# Patient Record
Sex: Male | Born: 1995 | Race: White | Hispanic: No | Marital: Single | Smoking: Current every day smoker
Health system: Southern US, Community
[De-identification: ages and names within clinical notes are randomized; demographics above are authoritative.]

---
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
## 2016-07-06 ENCOUNTER — Encounter (HOSPITAL_COMMUNITY): Payer: Self-pay | Admitting: Emergency Medicine

## 2016-07-06 ENCOUNTER — Emergency Department (HOSPITAL_COMMUNITY)
Admission: EM | Admit: 2016-07-06 | Discharge: 2016-07-06 | Disposition: A | Discharge status: Home / Self Care | Payer: Medicaid Other | Attending: Emergency Medicine | Admitting: Emergency Medicine

## 2016-07-06 DIAGNOSIS — W57XXXA Bitten or stung by nonvenomous insect and other nonvenomous arthropods, initial encounter: Secondary | ICD-10-CM | POA: Diagnosis not present

## 2016-07-06 DIAGNOSIS — L03114 Cellulitis of left upper limb: Secondary | ICD-10-CM | POA: Diagnosis not present

## 2016-07-06 DIAGNOSIS — Y9389 Activity, other specified: Secondary | ICD-10-CM | POA: Insufficient documentation

## 2016-07-06 DIAGNOSIS — F17200 Nicotine dependence, unspecified, uncomplicated: Secondary | ICD-10-CM | POA: Insufficient documentation

## 2016-07-06 DIAGNOSIS — Y9269 Other specified industrial and construction area as the place of occurrence of the external cause: Secondary | ICD-10-CM | POA: Insufficient documentation

## 2016-07-06 DIAGNOSIS — S61552A Open bite of left wrist, initial encounter: Secondary | ICD-10-CM | POA: Insufficient documentation

## 2016-07-06 DIAGNOSIS — Y990 Civilian activity done for income or pay: Secondary | ICD-10-CM | POA: Insufficient documentation

## 2016-07-06 MED ORDER — SULFAMETHOXAZOLE-TRIMETHOPRIM 800-160 MG PO TABS: 1.0000 | ORAL_TABLET | Freq: Two times a day (BID) | ORAL | 0 refills | Status: AC

## 2016-07-06 MED ORDER — SULFAMETHOXAZOLE-TRIMETHOPRIM 800-160 MG PO TABS
1.0000 | ORAL_TABLET | Freq: Once | ORAL | Status: AC
  Administered 2016-07-06: 1 via ORAL
  Filled 2016-07-06: qty 1

## 2016-07-06 NOTE — ED Triage Notes (Signed)
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   Pt has swollen red area to left wrist area

## 2016-07-06 NOTE — Discharge Instructions (Signed)
As discussed, it is very important that you monitor your condition carefully, and do not hesitate to return here for concerning changes in your condition.  Please take all medication as directed, and use warm compresses to facilitate the healing process per  Return here for concerning changes in your condition.

## 2016-07-06 NOTE — ED Provider Notes (Signed)
[facility] DEPT Provider Note   CSN: [id number] Arrival date & time: 07/06/16  1844     History   Chief Complaint Chief Complaint  Patient presents with  . Insect Bite    HPI Albert Wilkinson is a [age] y.o. male.  HPI  Patient presents with concern of lesion on the left anterior distal wrist. 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 He states that he spends substantial amount of time in tunnels. Yesterday, the patient recalls going through a nest of spiders. No immediate recognition of an injury. However, over the course of the past day he has noticed increasing erythema, darkening of the skin in the distal wrist. No loss of sensation or strength in the wrist, fingers. No fever, chills, nausea, vomiting. Patient is otherwise well, denies medical problems. No medication taken for relief.   History reviewed. No pertinent past medical history.  There are no active problems to display for this patient.   History reviewed. No pertinent surgical history.     Home Medications    Prior to Admission medications   Medication Sig Start Date End Date Taking? Authorizing Provider  sulfamethoxazole-trimethoprim (BACTRIM DS,SEPTRA DS) 800-160 MG tablet Take 1 tablet by mouth 2 (two) times daily. 07/06/16 07/13/16  Gerhard Munchobert Judge Duque, MD    Family History No family history on file.  Social History Social History  Substance Use Topics  . Smoking status: Current Every Day Smoker  . Smokeless tobacco: Never Used  . Alcohol use No     Allergies   Review of patient's allergies indicates not on file.   Review of Systems Review of Systems  Constitutional: Negative for fever.  Respiratory: Negative for shortness of breath.   Cardiovascular: Negative for chest pain.  Musculoskeletal:       Negative aside from HPI  Skin:       Negative aside from HPI  Allergic/Immunologic: Negative for immunocompromised state.  Neurological: Negative for weakness.      Physical Exam Updated Vital Signs BP 140/82 (BP Location: Right Arm)   Pulse 112   Temp 97.9 F (36.6 C)   Resp 25   Ht 5\' 9"  (1.753 m)   Wt 150 lb (68 kg)   SpO2 96%   BMI 22.15 kg/m   Physical Exam  Constitutional: He is oriented to person, place, and time. He appears well-developed. No distress.  HENT:  Head: Normocephalic and atraumatic.  Eyes: Conjunctivae and EOM are normal.  Cardiovascular: Normal rate and regular rhythm.   Pulmonary/Chest: Effort normal. No stridor. No respiratory distress.  Abdominal: He exhibits no distension.  Musculoskeletal: He exhibits no edema.  Neurological: He is alert and oriented to person, place, and time.  Patient has appropriate range of motion and sensation of all digits, hand, wrist on the affected side  Skin: Skin is warm and dry.  On the left anterior distal wrist there is a 4 cm erythematous slightly raised area with multiple punctate brown marks in the center.  Psychiatric: He has a normal mood and affect.  Nursing note and vitals reviewed.    ED Treatments / Results   Procedures Procedures (including critical care time)  Medications Ordered in ED Medications  sulfamethoxazole-trimethoprim (BACTRIM DS,SEPTRA DS) 800-160 MG per tablet 1 tablet (1 tablet Oral Given 07/06/16 1916)     Initial Impression / Assessment and Plan / ED Course  I have reviewed the triage vital signs and the nursing notes.  Pertinent labs & imaging results that were available during  my care of the patient were reviewed by me and considered in my medical decision making (see chart for details).  Clinical Course    This generally well young male presents with one day of increasing erythema, discomfort about a wound on his left wrist. Concern for cellulitis, no evidence for bacteremia or sepsis. Etiology may be spider bite, though this is unclear as well. Regardless, no palpated foreign bodies, no indication for drainage. Patient tolerated  initiation of antibiotics well, was discharged with same.  Final Clinical Impressions(s) / ED Diagnoses   Final diagnoses:  Cellulitis of left upper extremity  Insect bite, initial encounter    New Prescriptions Discharge Medication List as of 07/06/2016  7:09 PM    START taking these medications   Details  sulfamethoxazole-trimethoprim (BACTRIM DS,SEPTRA DS) 800-160 MG tablet Take 1 tablet by mouth 2 (two) times daily., Starting Thu 07/06/2016, Until Thu 07/13/2016, Print         Gerhard Munchobert Burnard Enis, MD 07/06/16 [phone]

## 2016-07-06 NOTE — ED Notes (Signed)
Pt stable, ambulatory, states understanding of discharge instructions 

## 2018-07-12 ENCOUNTER — Emergency Department (HOSPITAL_COMMUNITY)
Admission: EM | Admit: 2018-07-12 | Discharge: 2018-07-13 | Disposition: A | Discharge status: Other Acute Inpatient Hospital | Payer: Self-pay | Attending: Emergency Medicine | Admitting: Emergency Medicine

## 2018-07-12 ENCOUNTER — Encounter (HOSPITAL_COMMUNITY): Payer: Self-pay | Admitting: Emergency Medicine

## 2018-07-12 ENCOUNTER — Other Ambulatory Visit: Payer: Self-pay

## 2018-07-12 DIAGNOSIS — T401X2A Poisoning by heroin, intentional self-harm, initial encounter: Secondary | ICD-10-CM | POA: Insufficient documentation

## 2018-07-12 DIAGNOSIS — F322 Major depressive disorder, single episode, severe without psychotic features: Secondary | ICD-10-CM | POA: Insufficient documentation

## 2018-07-12 DIAGNOSIS — F17200 Nicotine dependence, unspecified, uncomplicated: Secondary | ICD-10-CM | POA: Insufficient documentation

## 2018-07-12 LAB — ACETAMINOPHEN LEVEL

## 2018-07-12 LAB — COMPREHENSIVE METABOLIC PANEL
ALK PHOS: 87 U/L (ref 38–126)
ALT: 9 U/L (ref 0–44)
AST: 21 U/L (ref 15–41)
Albumin: 4.3 g/dL (ref 3.5–5.0)
Anion gap: 12 (ref 5–15)
BUN: 7 mg/dL (ref 6–20)
CALCIUM: 8.9 mg/dL (ref 8.9–10.3)
CO2: 24 mmol/L (ref 22–32)
CREATININE: 1.03 mg/dL (ref 0.61–1.24)
Chloride: 102 mmol/L (ref 98–111)
GFR calc non Af Amer: >60 mL/min (ref >60)
GLUCOSE: 268 mg/dL — AB (ref 70–99)
Potassium: 3.5 mmol/L (ref 3.5–5.1)
Sodium: 138 mmol/L (ref 135–145)
Total Bilirubin: 0.6 mg/dL (ref 0.3–1.2)
Total Protein: 7.5 g/dL (ref 6.5–8.1)

## 2018-07-12 LAB — CBG MONITORING, ED: Glucose-Capillary: 117 mg/dL — ABNORMAL HIGH (ref 70–99)

## 2018-07-12 LAB — CBC WITH DIFFERENTIAL/PLATELET
ABS IMMATURE GRANULOCYTES: 0.07 10*3/uL (ref 0.00–0.07)
BASOS PCT: 0 %
Basophils Absolute: 0.1 10*3/uL (ref 0.0–0.1)
EOS ABS: 0.1 10*3/uL (ref 0.0–0.5)
EOS PCT: 1 %
HCT: 48.4 % (ref 39.0–52.0)
Hemoglobin: 14.9 g/dL (ref 13.0–17.0)
Immature Granulocytes: 1 %
Lymphocytes Relative: 15 %
Lymphs Abs: 2 10*3/uL (ref 0.7–4.0)
MCH: 27.1 pg (ref 26.0–34.0)
MCHC: 30.8 g/dL (ref 30.0–36.0)
MCV: 88 fL (ref 80.0–100.0)
MONO ABS: 0.7 10*3/uL (ref 0.1–1.0)
MONOS PCT: 5 %
Neutro Abs: 10.3 10*3/uL — ABNORMAL HIGH (ref 1.7–7.7)
Neutrophils Relative %: 78 %
Platelets: 327 10*3/uL (ref 150–400)
RBC: 5.5 MIL/uL (ref 4.22–5.81)
RDW: 13.6 % (ref 11.5–15.5)
WBC: 13.2 10*3/uL — ABNORMAL HIGH (ref 4.0–10.5)
nRBC: 0 % (ref 0.0–0.2)

## 2018-07-12 LAB — RAPID URINE DRUG SCREEN, HOSP PERFORMED
AMPHETAMINES: NOT DETECTED
BARBITURATES: NOT DETECTED
BENZODIAZEPINES: NOT DETECTED
Cocaine: NOT DETECTED
Opiates: NOT DETECTED
TETRAHYDROCANNABINOL: NOT DETECTED

## 2018-07-12 LAB — ETHANOL: Alcohol, Ethyl (B): <10 mg/dL (ref <10)

## 2018-07-12 LAB — SALICYLATE LEVEL: Salicylate Lvl: <7 mg/dL (ref 2.8–30.0)

## 2018-07-12 MED ORDER — SODIUM CHLORIDE 0.9 % IV BOLUS
1000.0000 mL | Freq: Once | INTRAVENOUS | Status: AC
  Administered 2018-07-12: 1000 mL via INTRAVENOUS

## 2018-07-12 MED ORDER — ACETAMINOPHEN 325 MG PO TABS: 650.0000 mg | ORAL_TABLET | ORAL | Status: DC | PRN

## 2018-07-12 MED ORDER — NICOTINE 21 MG/24HR TD PT24
21.0000 mg | MEDICATED_PATCH | Freq: Once | TRANSDERMAL | Status: AC
  Administered 2018-07-12 (×2): 21 mg via TRANSDERMAL
  Filled 2018-07-12: qty 1

## 2018-07-12 MED ORDER — SODIUM CHLORIDE 0.9 % IV SOLN: INTRAVENOUS | Status: DC

## 2018-07-12 NOTE — Progress Notes (Addendum)
Pt meets inpatient criteria per Reola Calkins, NP. Referral information has been sent to the following hospitals for review: [facility]  [facility]  [facility]  [facility]  [facility]  [facility]  [facility]  [facility]  [facility]   Disposition will continue to assist with placement needs.   Wells Guiles, LCSW, LCAS Disposition CSW [facility]S [phone]

## 2018-07-12 NOTE — ED Notes (Signed)
Pt. Place in the room Purple 48, updated pt. On plan of care.  Reviewed policies and visiting hours with pt.s mother.  Also gave pt. A pamphlet.

## 2018-07-12 NOTE — ED Notes (Signed)
TTS machine at bedside. 

## 2018-07-12 NOTE — ED Notes (Signed)
  Patient requested that his belongings be given to his parents.  Patients mother was given bag and took it to her car.
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
## 2018-07-12 NOTE — BH Assessment (Signed)
This Clinical research associate spoke with Doctor, general practice (Child psychotherapist, Disposition at [facility]) in regards to pt placement at [facility]. Maralyn Sago was notified that pt is currently being reviewed by the on call Dr Daleen Bo) for admission to the unit. Dr. Daleen Bo is reviewing the pt's chart and would like for this writer to give her a call back in an hour to obtain disposition for the pt.

## 2018-07-12 NOTE — ED Notes (Signed)
Received a call from [facility] requesting IVC (no longer applicable) and nursing notes. Contacted Sarah Social Work and she has sent information and will call to confirm with them.

## 2018-07-12 NOTE — ED Provider Notes (Signed)
Labs wnl.  Tachycardia improved after 2nd L of fluids.  Pt evaled by [facility] who recommended inpt.  Will move to psych hold.  Pt is medically clear.   Gwyneth Sprout, MD 07/12/18 [phone]

## 2018-07-12 NOTE — ED Notes (Signed)
Patient has been wanded by security and placed in purple scrubs.  Patient belongings have been taken from room and are at charge desk.

## 2018-07-12 NOTE — ED Provider Notes (Signed)
[facility] EMERGENCY DEPARTMENT Provider Note  CSN: [id number] Arrival date & time: 07/12/18 0545  Chief Complaint(s) Drug Overdose and Suicide Attempt  HPI Albert Wilkinson is a 22 y.o. male with a history of reported depression presents to the emergency departments for suicide attempt by overdosing on heroin.  Patient reports that he has been in remission for several months and has been having a lot of stress recently prompting him to use "a lot of heroin."  He denied any prior suicide attempts.  Denies any other coingestions.  Patient was found by [employer] after they were called for wellness check.  Patient received 1 dose of intranasal Narcan to which she responded well to.  EMS transported patient here to the emergency department and he remained hemodynamically stable in route.  He denies any recent fevers or infections.  No current headache, visual disturbance, chest pain, shortness of breath, abdominal pain, nausea.  Denies any recent vomiting or diarrhea.  Denies any homicidal ideations or AVH.  HPI  Past Medical History History reviewed. No pertinent past medical history. There are no active problems to display for this patient.  Home Medication(s) Prior to Admission medications   Not on File                                                                                                                                    Past Surgical History History reviewed. No pertinent surgical history. Family History History reviewed. No pertinent family history.  Social History Social History   Tobacco Use  . Smoking status: Current Every Day Smoker  . Smokeless tobacco: Never Used  Substance Use Topics  . Alcohol use: No  . Drug use: No   Allergies Patient has no known allergies.  Review of Systems Review of Systems All other systems are reviewed and are negative for acute change except as noted in the HPI  Physical Exam Vital Signs  I have reviewed the triage  vital signs BP (!) 149/90 (BP Location: Right Arm)   Pulse (!) 123   Temp 97.9 F (36.6 C) (Oral)   Resp 20   SpO2 100%   Physical Exam  Constitutional: He is oriented to person, place, and time. He appears well-developed and well-nourished. No distress.  HENT:  Head: Normocephalic and atraumatic.  Nose: Nose normal.  Eyes: Pupils are equal, round, and reactive to light. Conjunctivae and EOM are normal. Right eye exhibits no discharge. Left eye exhibits no discharge. No scleral icterus.  Neck: Normal range of motion. Neck supple.  Cardiovascular: Regular rhythm. Tachycardia present. Exam reveals no gallop and no friction rub.  No murmur heard. Pulmonary/Chest: Effort normal and breath sounds normal. No stridor. No respiratory distress. He has no rales.  Abdominal: Soft. He exhibits no distension. There is no tenderness.  Musculoskeletal: He exhibits no edema or tenderness.  Neurological: He is alert and oriented to person, place, and time.  Skin: Skin is warm and dry. No rash noted. He is not diaphoretic. No erythema.  Psychiatric: He has a normal mood and affect.  Vitals reviewed.   ED Results and Treatments Labs (all labs ordered are listed, but only abnormal results are displayed) Labs Reviewed  CBC WITH DIFFERENTIAL/PLATELET - Abnormal; Notable for the following components:      Result Value   WBC 13.2 (*)    Neutro Abs 10.3 (*)    All other components within normal limits  CBG MONITORING, ED - Abnormal; Notable for the following components:   Glucose-Capillary 117 (*)    All other components within normal limits  COMPREHENSIVE METABOLIC PANEL  SALICYLATE LEVEL  ACETAMINOPHEN LEVEL  ETHANOL  RAPID URINE DRUG SCREEN, HOSP PERFORMED  ACETAMINOPHEN LEVEL                                                                                                                         EKG  EKG Interpretation  Date/Time:  Friday July 12 2018 05:51:29 EST Ventricular Rate:   111 PR Interval:    QRS Duration: 86 QT Interval:  341 QTC Calculation: 464 R Axis:   82 Text Interpretation:  Sinus tachycardia NO STEMI No old tracing to compare Confirmed by Drema Pry [phone]) on 07/12/2018 7:14:54 AM      Radiology No results found. Pertinent labs & imaging results that were available during my care of the patient were reviewed by me and considered in my medical decision making (see chart for details).  Medications Ordered in ED Medications  sodium chloride 0.9 % bolus 1,000 mL (1,000 mLs Intravenous New Bag/Given 07/12/18 0622)    And  0.9 %  sodium chloride infusion (has no administration in time range)                                                                                                                                    Procedures Procedures  (including critical care time)  Medical Decision Making / ED Course I have reviewed the nursing notes for this encounter and the patient's prior records (if available in EHR or on provided paperwork).    Suicide attempt by heroin overdose.  Patient is currently afebrile with mild tachycardia but hemodynamically stable.  Alert and oriented x4.  Coingestion labs will be obtained.  He will require several hours of observation for medical clearance followed by behavioral health  evaluation.  IVC paperwork completed, but patient is willing to stay voluntarily.  Patient care turned over to Dr Anitra Lauth at 0700. Patient case and results discussed in detail; please see their note for further ED managment.     Final Clinical Impression(s) / ED Diagnoses Final diagnoses:  Intentional heroin overdose, initial encounter [facility])      This chart was dictated using voice recognition software.  Despite best efforts to proofread,  errors can occur which can change the documentation meaning.   Nira Conn, MD 07/12/18 [phone]

## 2018-07-13 ENCOUNTER — Inpatient Hospital Stay (HOSPITAL_COMMUNITY)
Admission: AD | Admit: 2018-07-13 | Discharge: 2018-07-15 | DRG: 885 | Disposition: A | Discharge status: Home / Self Care | Payer: Federal, State, Local not specified - Other | Source: Intra-hospital | Attending: Psychiatry | Admitting: Psychiatry

## 2018-07-13 ENCOUNTER — Other Ambulatory Visit: Payer: Self-pay

## 2018-07-13 ENCOUNTER — Encounter (HOSPITAL_COMMUNITY): Payer: Self-pay | Admitting: *Deleted

## 2018-07-13 DIAGNOSIS — F332 Major depressive disorder, recurrent severe without psychotic features: Principal | ICD-10-CM | POA: Diagnosis present

## 2018-07-13 DIAGNOSIS — F1490 Cocaine use, unspecified, uncomplicated: Secondary | ICD-10-CM | POA: Diagnosis present

## 2018-07-13 DIAGNOSIS — Z23 Encounter for immunization: Secondary | ICD-10-CM

## 2018-07-13 DIAGNOSIS — F1120 Opioid dependence, uncomplicated: Secondary | ICD-10-CM | POA: Diagnosis present

## 2018-07-13 MED ORDER — TRAZODONE HCL 50 MG PO TABS
50.0000 mg | ORAL_TABLET | Freq: Every evening | ORAL | Status: DC | PRN
  Filled 2018-07-13: qty 1
  Filled 2018-07-13: qty 7

## 2018-07-13 MED ORDER — INFLUENZA VAC SPLIT QUAD 0.5 ML IM SUSY
0.5000 mL | PREFILLED_SYRINGE | INTRAMUSCULAR | Status: AC
  Administered 2018-07-14: 0.5 mL via INTRAMUSCULAR
  Filled 2018-07-13: qty 0.5

## 2018-07-13 MED ORDER — NICOTINE 21 MG/24HR TD PT24
21.0000 mg | MEDICATED_PATCH | Freq: Every day | TRANSDERMAL | Status: DC
  Administered 2018-07-13: 21 mg via TRANSDERMAL
  Filled 2018-07-13: qty 1

## 2018-07-13 MED ORDER — ACETAMINOPHEN 325 MG PO TABS: 650.0000 mg | ORAL_TABLET | ORAL | Status: DC | PRN

## 2018-07-13 MED ORDER — ALUM & MAG HYDROXIDE-SIMETH 200-200-20 MG/5ML PO SUSP: 30.0000 mL | ORAL | Status: DC | PRN

## 2018-07-13 MED ORDER — MAGNESIUM HYDROXIDE 400 MG/5ML PO SUSP: 30.0000 mL | Freq: Every day | ORAL | Status: DC | PRN

## 2018-07-13 MED ORDER — HYDROXYZINE HCL 25 MG PO TABS
25.0000 mg | ORAL_TABLET | Freq: Four times a day (QID) | ORAL | Status: DC | PRN
  Filled 2018-07-13: qty 10
  Filled 2018-07-13: qty 1

## 2018-07-13 MED ORDER — PNEUMOCOCCAL VAC POLYVALENT 25 MCG/0.5ML IJ INJ
0.5000 mL | INJECTION | INTRAMUSCULAR | Status: AC
  Administered 2018-07-14: 0.5 mL via INTRAMUSCULAR

## 2018-07-13 MED ORDER — NICOTINE 21 MG/24HR TD PT24
21.0000 mg | MEDICATED_PATCH | Freq: Once | TRANSDERMAL | Status: DC
  Administered 2018-07-14: 21 mg via TRANSDERMAL
  Filled 2018-07-13: qty 1

## 2018-07-13 NOTE — ED Notes (Signed)
Pt given bed in place of stretcher for comfort. Pt noted to be lying on bed watching tv.

## 2018-07-13 NOTE — Progress Notes (Signed)
Did not attend group 

## 2018-07-13 NOTE — ED Notes (Signed)
Mother and her significant other visiting w/pt.

## 2018-07-13 NOTE — Progress Notes (Signed)
D.  Pt is new admission to unit, went to bed shortly after arrival.  Pt did not get up for evening wrap up group.  No interaction on unit at this time.  Pt resting in bed with eyes closed, even and unlabored respirations   A.  Will continue to monitor.  R.  Pt remain safe on the unit.

## 2018-07-13 NOTE — ED Notes (Signed)
Pt voiced understanding and agreement w/tx plan - accepted to [facility] - signed consent form - copy faxed to [facility], copy sent to Medical Records, and original placed in envelope for [facility]. Pt stated he will wait to advise his mother when she returns.

## 2018-07-13 NOTE — BH Assessment (Addendum)
Per Isidoro Donning, pt is accepted to [facility] 306-2 to arrive after 5 pm. Attending MD is Dr. Altamese Avoca. Notified Kriste Basque, ED RN.

## 2018-07-13 NOTE — ED Notes (Signed)
Pt and parents aware of tx plan - Inpt. Pt states his Nicotine Patch fell off in shower - new one applied. Parents leaving at this time.

## 2018-07-13 NOTE — BH Assessment (Signed)
Reassessment--Pt was calm and cooperative with a pleasant demeanor. He states that he is feeling "much better" and that he got a lot of sleep. He admits to attempting suicide and knowing that the amount of his overdose he used would possibly kill him. He denies HI, AVH. He states that his conflict with his child's mother has been ongoing for a while. He states that he works at [employer]. Pt states that he has not used in a while before this overdose. Pt is oriented x4 with casual appearance, pleasant mood with congruent affect. He states that he would like treatment to help him stabilize.  TTS continues to seek placement.

## 2018-07-13 NOTE — Tx Team (Signed)
Initial Treatment Plan 07/13/2018 6:45 PM [name] ZOX:[id number]    PATIENT STRESSORS: Marital or family conflict Substance abuse   PATIENT STRENGTHS: Ability for insight Average or above average intelligence Communication skills General fund of knowledge   PATIENT IDENTIFIED PROBLEMS: Depression Anxiety Suicidal thoughts Substance abuse "Whatever I can do to help with my depression and anxiety"                     DISCHARGE CRITERIA:  Ability to meet basic life and health needs Improved stabilization in mood, thinking, and/or behavior Reduction of life-threatening or endangering symptoms to within safe limits Verbal commitment to aftercare and medication compliance  PRELIMINARY DISCHARGE PLAN: Attend aftercare/continuing care group Return to previous living arrangement  PATIENT/FAMILY INVOLVEMENT: This treatment plan has been presented to and reviewed with the patient, Albert Wilkinson, and/or family member, .  The patient and family have been given the opportunity to ask questions and make suggestions.  Leea Rambeau, [place], [place] 07/13/2018, 6:45 PM

## 2018-07-13 NOTE — ED Notes (Signed)
Pt's mother and sister visiting.

## 2018-07-13 NOTE — Progress Notes (Signed)
Quantarius is a 22 year old male pt admitted on voluntary basis. On admission, he denies any SI currently but does endorse that he has been feeling suicidal recently. He is able to contract for safety while in the hospital. He reports that he was on the phone with a friend and told him what he was doing and the friend called police who then went to check on him. He reports some problems with the mother of his child as a current stressor. He reports a past history of heroin usage but reports that he does not use on a daily basis and reports that he does not use any other substances. He reports that he is not now nor has he ever been on any medications. He denies any medical problems and does not appear in any acute distress on admission. He reports that he lives at home with his mother, sister and his child and reports that he will return there once he is discharged. Americo was escorted to the unit, oriented to the milieu and safety maintained.

## 2018-07-13 NOTE — ED Notes (Signed)
Breakfast tray ordered 

## 2018-07-14 DIAGNOSIS — F332 Major depressive disorder, recurrent severe without psychotic features: Principal | ICD-10-CM

## 2018-07-14 DIAGNOSIS — F1120 Opioid dependence, uncomplicated: Secondary | ICD-10-CM

## 2018-07-14 MED ORDER — CITALOPRAM HYDROBROMIDE 10 MG PO TABS
10.0000 mg | ORAL_TABLET | Freq: Every day | ORAL | Status: DC
  Administered 2018-07-14 – 2018-07-15 (×2): 10 mg via ORAL
  Filled 2018-07-14: qty 7
  Filled 2018-07-14 (×4): qty 1

## 2018-07-14 NOTE — BHH Suicide Risk Assessment (Signed)
[facility] Admission Suicide Risk Assessment   Nursing information obtained from:  Patient Demographic factors:  Male, Adolescent or young adult, Caucasian Current Mental Status:  NA Loss Factors:  NA Historical Factors:  NA Risk Reduction Factors:  Responsible for children under 21 years of age, Living with another person, especially a relative, Positive coping skills or problem solving skills  Total Time spent with patient: 20 minutes Principal Problem: <principal problem not specified> Diagnosis:   Patient Active Problem List   Diagnosis Date Noted  . Severe recurrent major depression without psychotic features (HCC) [F33.2] 07/13/2018   Subjective Data: Patient is seen and examined.  Patient is a 22 year old male with a past psychiatric history significant for opioid dependence who presented to the [facility] emergency department on 07/12/2018 after an intentional overdose of heroin.  Patient stated that he had been having problems with the biological mother of his child.  He stated that he gets into arguments with her over custody issues of their child.  They are trying to work these things out, but he continues to get upset over dealing with her.  On the night of admission he got into an argument, and this upset him so greatly that he decided to take an intentional overdose of heroin.  He stated he snorted to $20 bag of heroin in an attempt to kill himself.  He has a history of opioid dependence, but approximately 8 to 12 months ago had stopped using drugs on his own.  He has been clean for several months until the night of admission.  He also had recently got a job in a [employer].  He denied any previous self-harm, he denied any previous psychiatric admissions, he denied any previous admissions for substance rehabilitation.  He currently lives with his mother and his sister.  After the intentional overdose of heroin he was found by [facility] police after they were called for a wellness check.   He received 1 dose of intranasal Narcan to which he responded quite well.  He was basically kept in the emergency room for 2 days until a bed could be found for admission.  He was admitted to the hospital for evaluation and stabilization.  He denied any devious treatment for depression.  He stated that he was feeling depressed about the circumstances with the biological mother of the child.  He stated he felt at times helpless, hopeless, worthless, upset, and angry.  He was admitted to the hospital for evaluation and stabilization.  Continued Clinical Symptoms:  Alcohol Use Disorder Identification Test Final Score (AUDIT): 0 The "Alcohol Use Disorders Identification Test", Guidelines for Use in Primary Care, Second Edition.  World Science writer [facility]). Score between 0-7:  no or low risk or alcohol related problems. Score between 8-15:  moderate risk of alcohol related problems. Score between 16-19:  high risk of alcohol related problems. Score 20 or above:  warrants further diagnostic evaluation for alcohol dependence and treatment.   CLINICAL FACTORS:   Depression:   Anhedonia Comorbid alcohol abuse/dependence Hopelessness Impulsivity Insomnia Alcohol/Substance Abuse/Dependencies   Musculoskeletal: Strength & Muscle Tone: within normal limits Gait & Station: normal Patient leans: N/A  Psychiatric Specialty Exam: Physical Exam  Nursing note and vitals reviewed. Constitutional: He is oriented to person, place, and time. He appears well-developed and well-nourished.  HENT:  Head: Normocephalic and atraumatic.  Respiratory: Effort normal.  Neurological: He is alert and oriented to person, place, and time.    ROS  Blood pressure (!) 152/74, pulse  71, temperature 97.9 F (36.6 C), temperature source Oral, resp. rate 18, height 5\' 8"  (1.727 m), weight 59.9 kg.Body mass index is 20.07 kg/m.  General Appearance: Casual  Eye Contact:  Good  Speech:  Normal Rate  Volume:  Normal   Mood:  Anxious  Affect:  Congruent  Thought Process:  Coherent and Descriptions of Associations: Intact  Orientation:  Full (Time, Place, and Person)  Thought Content:  Logical  Suicidal Thoughts:  No  Homicidal Thoughts:  No  Memory:  Immediate;   Fair Recent;   Fair Remote;   Fair  Judgement:  Intact  Insight:  Fair  Psychomotor Activity:  Normal  Concentration:  Concentration: Fair and Attention Span: Fair  Recall:  [employer] of Knowledge:  Good  Language:  Good  Akathisia:  Negative  Handed:  Right  AIMS (if indicated):     Assets:  Communication Skills Desire for Improvement Financial Resources/Insurance Housing Physical Health Resilience Social Support  ADL's:  Intact  Cognition:  WNL  Sleep:  Number of Hours: 6.75      COGNITIVE FEATURES THAT CONTRIBUTE TO RISK:  None    SUICIDE RISK:   Minimal: No identifiable suicidal ideation.  Patients presenting with no risk factors but with morbid ruminations; may be classified as minimal risk based on the severity of the depressive symptoms  PLAN OF CARE: Patient is seen and examined.  Patient is a 22 year old male with the above-stated past psychiatric history who was admitted to the hospital after an intentional overdose of heroin.  He has no history of suicide attempts, has been clean of opiates for several months, and has never been admitted to the psychiatric hospital.  He denied any previous history of psychiatric evaluations or psychiatric treatment.  He did admit to neurovegetative symptoms of depression.  We will start Celexa 10 mg p.o. daily.  He will be admitted to the hospital.  He will be integrated into the milieu.  He will be encouraged to attend groups.  We will talk to his mother and get collateral information to confirm all the above.  He will be placed on 15-minute checks for safety as well as monitoring for withdrawal from heroin in case he is not being openly upfront with [place].  His drug screen was  essentially negative, but his blood sugar is significantly elevated.  We will monitor his blood sugar to make sure that he is not developing diabetes.  I certify that inpatient services furnished can reasonably be expected to improve the patient's condition.   Antonieta Pert, MD 07/14/2018, 9:31 AM

## 2018-07-14 NOTE — H&P (Signed)
Psychiatric Admission Assessment Adult  Patient Identification: Albert Wilkinson MRN:  [id number] Date of Evaluation:  07/14/2018 Chief Complaint:  MDD WITH SI ATTEMPT COCAINE USE Principal Diagnosis: Severe recurrent major depression without psychotic features (HCC) Diagnosis:   Patient Active Problem List   Diagnosis Date Noted  . Uncomplicated opioid dependence (HCC) [F11.20]   . Severe recurrent major depression without psychotic features (HCC) [F33.2] 07/13/2018   CC: I attempted suicide.   He resides with his mother and sister (30), and his 80 month old son. He is currently employed and works full time. Currently not in school at this time. He denies any psychiatric illness prior to this suicide attempt and reports this attempt was impulsive and situational. "Immediately after I regretted it and I was remorseful. I have never used drugs prior to the attempt.    Per EDP Report: Albert Wilkinson a22 y.o.malewith a history of reported depression presents to the emergency departments for suicide attempt by overdosing on heroin. Patient reports that he has been in remission for several months and has been having a lot of stress recently prompting him to use "a lot of heroin." He denied any prior suicide attempts. Denies any other coingestions. Patient was found by [facility] after they were called for wellness check. Patient received 1 dose of intranasal Narcan to which she responded well to. EMS transported patient here to the emergency department and he remained hemodynamically stable in route.  TTS Report: Patient states that he and his child's mother got into an argument last night.  He states that everytime they are not getting along that she threatens to take his son away from him and not let him see his son anymore.  Patient states that she has done this on several occasions and he states that she has left with his son on a couple of occasions.  He states that last night she made  these threats because she felt like patient had eaten some food that she had purchased for their son.  Patient states that he tries to reason with his child's mother, but she refuses to listen.  Patient states that he gets really depressed over the situation and last night that he was so upset that he tried to overdose on heroin in a suicide attempt, he snorted a $20 bag, in an attempt to kill himself.  Patient states that he has a history of heroin use, but had been clean for several months until last night.  After his use of the heroin, he became non-responsive.  Patient denies any history of treatment for his depression on an inpatient basis or an outpatient basis.  Patient denies HI/Psychosis.  Patient states that he has experienced the following depressive symptoms: decreased concentration, feeling hopeless or helpless, decreased energy and motivation as well as a depressed mood. Patient states that he started using heroin at age 39, but states that he quit several months ago.  Patient states that he has a history of using daily, 1/2 gram.  Patient states that he has never been in any SA treatment programs.  Patient states that eh lives with his mother, her boyfriend, his girlfriend and his child as well as a sister, her daughter and boyfriend.  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  Patient presented as alert and oriented.  His mood depressed and his affect was depressed.  Patient's insight, judgement and impulse control were impaired.  His thoughts were organized and  his memory intact.  Patient's eye contact was good and his speech was clear and coherent.  His psycho-motor activity was normal.  He did not appear to be responding to any internal stimuli.  Patient was cooperative, clean and neat.  Associated Signs/Symptoms: Depression Symptoms:  depressed mood, suicidal attempt, loss of energy/fatigue, disturbed sleep, (Hypo) Manic Symptoms:   Impulsivity, Irritable Mood, Anxiety Symptoms:  Excessive worry Psychotic Symptoms:  Denies PTSD Symptoms: Negative Total Time spent with patient: 30 minutes  Past Psychiatric History: Denies. Reports first suicide attempt and feels regretful at this time. He denies any substance abuse history. No previous psychiatric medications.   Is the patient at risk to self? Yes.    Has the patient been a risk to self in the past 6 months? Yes.    Has the patient been a risk to self within the distant past? No.  Is the patient a risk to others? No.  Has the patient been a risk to others in the past 6 months? No.  Has the patient been a risk to others within the distant past? No.   Prior Inpatient Therapy:  Denies  Prior Outpatient Therapy:  Denies  Alcohol Screening: 1. How often do you have a drink containing alcohol?: Never 2. How many drinks containing alcohol do you have on a typical day when you are drinking?: 1 or 2 3. How often do you have six or more drinks on one occasion?: Never AUDIT-C Score: 0 4. How often during the last year have you found that you were not able to stop drinking once you had started?: Never 5. How often during the last year have you failed to do what was normally expected from you becasue of drinking?: Never 6. How often during the last year have you needed a first drink in the morning to get yourself going after a heavy drinking session?: Never 7. How often during the last year have you had a feeling of guilt of remorse after drinking?: Never 8. How often during the last year have you been unable to remember what happened the night before because you had been drinking?: Never 9. Have you or someone else been injured as a result of your drinking?: No 10. Has a relative or friend or a doctor or another health worker been concerned about your drinking or suggested you cut down?: No Alcohol Use Disorder Identification Test Final Score (AUDIT):  0 Intervention/Follow-up: AUDIT Score <7 follow-up not indicated Substance Abuse History in the last 12 months:  Yes.   Consequences of Substance Abuse: Withdrawal Symptoms:   Headaches Tremors Previous Psychotropic Medications: No  Psychological Evaluations: No  Past Medical History: History reviewed. No pertinent past medical history. History reviewed. No pertinent surgical history. Family History: History reviewed. No pertinent family history. Family Psychiatric  History: Mother and Father- history of substance abuse.  Tobacco Screening: Have you used any form of tobacco in the last 30 days? (Cigarettes, Smokeless Tobacco, Cigars, and/or Pipes): Yes Tobacco use, Select all that apply: 5 or more cigarettes per day Are you interested in Tobacco Cessation Medications?: Yes, will notify MD for an order Counseled patient on smoking cessation including recognizing danger situations, developing coping skills and basic information about quitting provided: Refused/Declined practical counseling Social History:  Social History   Substance and Sexual Activity  Alcohol Use No     Social History   Substance and Sexual Activity  Drug Use Yes  . Types: Heroin    Additional Social History:  Allergies:  No Known Allergies Lab Results: No results found for this or any previous visit (from the past 48 hour(s)).  Blood Alcohol level:  Lab Results  Component Value Date   ETH <10 07/12/2018    Metabolic Disorder Labs:  No results found for: HGBA1C, MPG No results found for: PROLACTIN No results found for: CHOL, TRIG, HDL, CHOLHDL, VLDL, LDLCALC  Current Medications: Current Facility-Administered Medications  Medication Dose Route Frequency Provider Last Rate Last Dose  . acetaminophen (TYLENOL) tablet 650 mg  650 mg Oral Q4H PRN Ravi, Himabindu, MD      . alum & mag hydroxide-simeth (MAALOX/MYLANTA) 200-200-20 MG/5ML suspension 30 mL  30 mL Oral Q4H PRN Nira Conn A, NP      .  citalopram (CELEXA) tablet 10 mg  10 mg Oral Daily Antonieta Pert, MD   10 mg at 07/14/18 0931  . hydrOXYzine (ATARAX/VISTARIL) tablet 25 mg  25 mg Oral Q6H PRN Nira Conn A, NP      . magnesium hydroxide (MILK OF MAGNESIA) suspension 30 mL  30 mL Oral Daily PRN Nira Conn A, NP      . nicotine (NICODERM CQ - dosed in mg/24 hours) patch 21 mg  21 mg Transdermal Once Daleen Bo, Himabindu, MD   21 mg at 07/14/18 0933  . traZODone (DESYREL) tablet 50 mg  50 mg Oral QHS PRN Nira Conn A, NP       PTA Medications: No medications prior to admission.    Musculoskeletal: Strength & Muscle Tone: within normal limits Gait & Station: normal Patient leans: N/A  Psychiatric Specialty Exam: Physical Exam  ROS  Blood pressure 116/85, pulse (!) 128, temperature 98.2 F (36.8 C), temperature source Oral, resp. rate 16, height 5\' 8"  (1.727 m), weight 59.9 kg.Body mass index is 20.07 kg/m.  General Appearance: Fairly Groomed  Eye Contact:  Fair  Speech:  Clear and Coherent and Normal Rate  Volume:  Normal  Mood:  normal and better  Affect:  Congruent  Thought Process:  Coherent, Linear and Descriptions of Associations: Intact  Orientation:  Full (Time, Place, and Person)  Thought Content:  WDL  Suicidal Thoughts:  No  Homicidal Thoughts:  No  Memory:  Immediate;   Fair Recent;   Fair  Judgement:  Intact  Insight:  Present  Psychomotor Activity:  Normal  Concentration:  Concentration: Fair and Attention Span: Fair  Recall:  [employer] of Knowledge:  Fair  Language:  Fair  Akathisia:  No  Handed:  Right  AIMS (if indicated):     Assets:  Communication Skills Desire for Improvement Financial Resources/Insurance Housing Leisure Time Social Support  ADL's:  Intact  Cognition:  WNL  Sleep:  Number of Hours: 6.75    Treatment Plan Summary: Daily contact with patient to assess and evaluate symptoms and progress in treatment and Medication management  1 Admit for crisis management  and stabilization.  2. Medication management to reduce symptoms to baseline and improved the patient's overall level of functioning. Closely monitor the side effects, efficacy and therapeutic response of medication.  3. Treat health problem as indicated. Citalopram 10mg  po daily for depression and anxiety. First dose of celexa was administered today.  4. Developed treatment plan to decrease the risk of relapse upon discharge and to reduce the need for readmission.  5. Psychosocial education regarding relapse prevention in self-care.  6. Healthcare followup as needed for medical problems and called consults as indicated.  7. Increase collateral information.  8. Restart home medication where appropriate  9. Encouraged to participate and verbalize into group milieu therapy.   Observation Level/Precautions:  15 minute checks  Laboratory:  Labs obtained in the ED have been reviewed and assessed.   Psychotherapy:  Individual and group therapy  Medications:  See above  Consultations:  Per need  Discharge Concerns:   See above  Estimated LOS: 3-5 days  Other:     Physician Treatment Plan for Primary Diagnosis: Severe recurrent major depression without psychotic features (HCC) Long Term Goal(s): Improvement in symptoms so as ready for discharge  Short Term Goals: Ability to identify changes in lifestyle to reduce recurrence of condition will improve, Ability to verbalize feelings will improve and Ability to disclose and discuss suicidal ideas  Physician Treatment Plan for Secondary Diagnosis: Active Problems:   Severe recurrent major depression without psychotic features (HCC)   Uncomplicated opioid dependence (HCC)  Long Term Goal(s): Improvement in symptoms so as ready for discharge  Short Term Goals: Ability to identify and develop effective coping behaviors will improve, Ability to maintain clinical measurements within normal limits will improve and Compliance with prescribed medications  will improve  I certify that inpatient services furnished can reasonably be expected to improve the patient's condition.    Maryagnes Amos, FNP 11/10/20191:43 PM

## 2018-07-14 NOTE — Progress Notes (Signed)
D.  Pt pleasant on approach, no complaints voiced.  Pt was positive for evening AA group, observed engaged in appropriate interaction with peers on the unit.  Pt denies SI/HI/AVH at this time.  A.  Support and encouragement offered, medication given as ordered  R.  Pt remains safe on the unit, will continue to monitor.   

## 2018-07-14 NOTE — [facility] Group Notes (Signed)
[facility] Group Notes:  (Nursing)  Date:  07/14/2018  Time: 130 PM Type of Therapy:  Nurse Education  Participation Level:  Active  Participation Quality:  Appropriate and Attentive  Affect:  Appropriate  Cognitive:  Alert and Appropriate  Insight:  Appropriate and Good  Engagement in Group:  Engaged  Modes of Intervention:  Discussion and Education  Summary of Progress/Problems: Nurse led psycho-educational group: Sleep hygiene  Shela Nevin 07/14/2018, 3:08 PM

## 2018-07-14 NOTE — B[facility] Counselor (Signed)
Adult Comprehensive Assessment  Patient ID: MATTIA LIFORD, male   DOB: Jun 08, 1996, 22 y.o.   MRN: [id number]  Information Source: Information source: Patient  Current Stressors:  Patient states their primary concerns and needs for treatment are:: anxiety and depression Patient states their goals for this hospitilization and ongoing recovery are:: Working treating depression and anxiety Educational / Learning stressors: no Employment / Job issues: no Family Relationships: no Surveyor, quantity / Lack of resources (include bankruptcy): no Housing / Lack of housing: no Physical health (include injuries & life threatening diseases): no Social relationships: The mother of my child  Substance abuse: is currently abstinent from heroin Bereavement / Loss: loss my father when I was [age]  Living/Environment/Situation:  Living Arrangements: Parent Living conditions (as described by patient or guardian): good Who else lives in the home?: mother, sister and my child How long has patient lived in current situation?: 4 years What is atmosphere in current home: Comfortable, Paramedic  Family History:  Marital status: Single Are you sexually active?: No What is your sexual orientation?: straight male Has your sexual activity been affected by drugs, alcohol, medication, or emotional stress?: no Does patient have children?: Yes How many children?: 1 How is patient's relationship with their children?: good with 56 month old son  Childhood History:  By whom was/is the patient raised?: Grandparents Additional childhood history information: Father died when patient was 12 , mother was not emotionally available Description of patient's relationship with caregiver when they were a child: Very close Patient's description of current relationship with people who raised him/her: Emelia Loron has passed away and grandmother died How were you disciplined when you got in trouble as a child/adolescent?: no problems,  spanked Does patient have siblings?: Yes Number of Siblings: 3 Description of patient's current relationship with siblings: with middle sister I am close, don't get along with she older sister, not much with half Did patient suffer any verbal/emotional/physical/sexual abuse as a child?: No Did patient suffer from severe childhood neglect?: No Has patient ever been sexually abused/assaulted/raped as an adolescent or adult?: No Was the patient ever a victim of a crime or a disaster?: No Witnessed domestic violence?: No Has patient been effected by domestic violence as an adult?: No  Education:  Highest grade of school patient has completed: 12th grade Currently a student?: No Learning disability?: No  Employment/Work Situation:   Employment situation: Employed Where is patient currently employed?: [employer] How long has patient been employed?: 8 months  Patient's job has been impacted by current illness: No What is the longest time patient has a held a job?: [employer] 2 for 4 years Did You Receive Any Psychiatric Treatment/Services While in Equities trader?: No Are There Guns or Other Weapons in Your Home?: Yes Types of Guns/Weapons: Guns Are These Comptroller?: Yes  Financial Resources:   Financial resources: Income from employment Does patient have a representative payee or guardian?: No  Alcohol/Substance Abuse:   What has been your use of drugs/alcohol within the last 12 months?: one or twice this year but is abstinent If attempted suicide, did drugs/alcohol play a role in this?: No Alcohol/Substance Abuse Treatment Hx: Denies past history Has alcohol/substance abuse ever caused legal problems?: No  Social Support System:   Patient's Community Support System: Good Describe Community Support System: mother, sister, her boyfriend Type of faith/religion: Ephriam Knuckles How does patient's faith help to cope with current illness?: It does  Leisure/Recreation:    Leisure and Hobbies: Video games, playing football,  boating, hunting, reading  Strengths/Needs:   What is the patient's perception of their strengths?: Good dad, helpful, kind, quick learner Patient states they can use these personal strengths during their treatment to contribute to their recovery: Being more active talking about it, make new friend Patient states these barriers may affect/interfere with their treatment: no Patient states these barriers may affect their return to the community: no Other important information patient would like considered in planning for their treatment: no  Discharge Plan:   Currently receiving community mental health services: No Does patient have access to transportation?: Yes Does patient have financial barriers related to discharge medications?: No Will patient be returning to same living situation after discharge?: Yes  Summary/Recommendations:   Summary and Recommendations (to be completed by the evaluator): KEONDRE MARKSON is a 22 y.o. male with a history of reported depression presents to the emergency departments for suicide attempt by overdosing on heroin.  Patient reports that he has been in remission for several months and has been having a lot of stress recently prompting him to use "a lot of heroin."  He denied any prior suicide attempts.   Patient was found by [facility] after they were called for wellness check.  Patient received 1 dose of intranasal Narcan to which she responded well to.  Primary stressor is related to conflict between him and the mother . Patient states that he has experienced the following depressive symptoms: decreased concentration, feeling hopeless or helpless, decreased energy and motivation as well as a depressed mood. Patient states that he started using heroin at age 22, but states that he quit several months ago.     Patient will benefit from crisis stabilization, medication evaluation, group therapy and psychoeducation, in  addition to case management for discharge planning. At discharge it is recommended that Patient adhere to the established discharge plan and continue in treatment. Anticipated outcomes: Mood will be stabilized, crisis will be stabilized, medications will be established if appropriate, coping skills will be taught and practiced, family session will be done to determine discharge plan, mental illness will be normalized, patient will be better equipped to recognize symptoms and ask for assistance.   Evorn Gong. 07/14/2018

## 2018-07-14 NOTE — Progress Notes (Addendum)
D. Pt presents with an anxious affect/mood- friendly during interactions. Calm, cooperative behavior. Pt appears to minimize his actions- reporting that his SA was impulsive, and that he is ready to be discharged so that he can go home and be with his 17 mo old child. Per pt's self inventory, pt rates his depression, hopelessness and anxiety 2/0/5, respectively. Pt writes that his most important goal today is "beating depression". Pt currently denies SI/HI and AV hallucinations A. Labs and vitals monitored. Pt compliant with medications. Pt supported emotionally and encouraged to express concerns and ask questions.   R. Pt remains safe with 15 minute checks. Will continue POC.

## 2018-07-14 NOTE — [facility] Group Notes (Signed)
[facility] LCSW Group Therapy Note  07/14/2018  9:00-10:00AM  Type of Therapy and Topic:  Group Therapy:  Adding Supports Including Being Your Own Support  Participation Level:  None   Description of Group:  Patients in this group were introduced to the concept that additional supports including self-support are an essential part of recovery.  A song entitled "I Need Help!" was played and a group discussion was held in reaction to the idea of needing to add supports.  A song entitled "My Own Hero" was played and a group discussion ensued in which patients stated they could relate to the song and it inspired them to realize they have be willing to help themselves in order to succeed, because other people cannot achieve sobriety or stability for them.  We discussed adding a variety of healthy supports to address the various needs in their lives.  A song was played called "I Know Where I've Been" toward the end of group and used to conduct an inspirational wrap-up to group of remembering how far they have already come in their journey.  Therapeutic Goals: 1)  demonstrate the importance of being a part of one's own support system 2)  discuss reasons people in one's life may eventually be unable to be continually supportive  3)  identify the patient's current support system and   4)  elicit commitments to add healthy supports and to become more conscious of being self-supportive   Summary of Patient Progress:  The patient arrived in group within the last 10 minutes before it was over.  He sat and listened attentively but did not engage in the discussion.   Therapeutic Modalities:   Motivational Interviewing Activity  Lynnell Chad

## 2018-07-14 NOTE — Progress Notes (Signed)
Patient did attend the evening speaker AA meeting.  

## 2018-07-15 LAB — GLUCOSE, CAPILLARY: GLUCOSE-CAPILLARY: 89 mg/dL (ref 70–99)

## 2018-07-15 MED ORDER — NICOTINE 21 MG/24HR TD PT24
21.0000 mg | MEDICATED_PATCH | Freq: Every day | TRANSDERMAL | Status: DC
  Administered 2018-07-15: 21 mg via TRANSDERMAL
  Filled 2018-07-15 (×3): qty 1

## 2018-07-15 MED ORDER — HYDROXYZINE HCL 25 MG PO TABS: 25.0000 mg | ORAL_TABLET | Freq: Four times a day (QID) | ORAL | 0 refills | Status: AC | PRN

## 2018-07-15 MED ORDER — CITALOPRAM HYDROBROMIDE 10 MG PO TABS: 10.0000 mg | ORAL_TABLET | Freq: Every day | ORAL | 0 refills | Status: AC

## 2018-07-15 MED ORDER — TRAZODONE HCL 50 MG PO TABS: 50.0000 mg | ORAL_TABLET | Freq: Every evening | ORAL | 0 refills | Status: AC | PRN

## 2018-07-15 NOTE — Progress Notes (Signed)
  [facility] Adult Case Management Discharge Plan :  Will you be returning to the same living situation after discharge:  Yes,  home At discharge, do you have transportation home?: Yes,  mother coming at 1:30PM Do you have the ability to pay for your medications: Yes,  mental health  Release of information consent forms completed and submitted to medical records by CSW.   Patient to Follow up at: Follow-up Information    [facility].   Specialty:  Behavioral Health Why:  Scheduling office closed today for holiday. Walk in within 3 days of hospital discharge to be assessed for outpatient mental health services including: medication management and therapy. WALK IN HOURS: MON-FRI 8am-10am. Thank you.  Contact information: [address] [place] [address] [phone]           Next level of care provider has access to [facility] Link:no  Safety Planning and Suicide Prevention discussed: Yes,  SPE completed with pt and his mother. SPI pamphlet and mobile crisis information provided to pt.   Have you used any form of tobacco in the last 30 days? (Cigarettes, Smokeless Tobacco, Cigars, and/or Pipes): Yes  Has patient been referred to the Quitline?: Patient refused referral  Patient has been referred for addiction treatment: Yes  Rona Ravens, LCSW 07/15/2018, 11:10 AM

## 2018-07-15 NOTE — Progress Notes (Signed)
Patient ID: Albert Wilkinson, male   DOB: 02-13-1996, 22 y.o.   MRN: [id number]  D. Pt presents with a flat affect and cooperative behavior. Pt reports that he thinks his living situation should be better when he leaves today. Pt currently denies SI/HI and AVH and agrees to contact staff before acting on any harmful thoughts.   A. Labs and vitals monitored. Pt given and educated on medications. Pt supported emotionally and encouraged to express concerns and ask questions.    R. Pt remains safe with 15 minute checks. To be discharged per MD orders. Will continue POC.

## 2018-07-15 NOTE — [facility] Group Notes (Signed)
Pt attended spiritual care group on grief and loss facilitated by chaplain Dempsey Knotek   Group goal of establishing open and affirming space for members to engage grief, normalize and support grief experience and provide psycho social education and grief support. Group opened with brief discussion and psycho-social ed around grief and loss in relationships and in relation to self - identifying life patterns, circumstances, changes connected to grief response. Group participated in facilitated process group around topic of grief.   

## 2018-07-15 NOTE — [facility] Suicide Risk Assessment (Signed)
[facility] INPATIENT:  Family/Significant Other Suicide Prevention Education  Suicide Prevention Education:  Education Completed; Amro Winebarger (Pt's mother) [phone] has been identified by the patient as the family member/significant other with whom the patient will be residing, and identified as the person(s) who will aid the patient in the event of a mental health crisis (suicidal ideations/suicide attempt).  With written consent from the patient, the family member/significant other has been provided the following suicide prevention education, prior to the and/or following the discharge of the patient.  The suicide prevention education provided includes the following:  Suicide risk factors  Suicide prevention and interventions  National Suicide Hotline telephone number  [facility] assessment telephone number  [facility] Emergency Assistance 911  [facility] and/or Residential Mobile Crisis Unit telephone number  Request made of family/significant other to:  Remove weapons (e.g., guns, rifles, knives), all items previously/currently identified as safety concern.    Remove drugs/medications (over-the-counter, prescriptions, illicit drugs), all items previously/currently identified as a safety concern.  The family member/significant other verbalizes understanding of the suicide prevention education information provided.  The family member/significant other agrees to remove the items of safety concern listed above.  Rona Ravens LCSW 07/15/2018, 11:10 AM

## 2018-07-15 NOTE — Progress Notes (Signed)
Patient ID: Albert Wilkinson, male   DOB: 20-Jun-1996, 22 y.o.   MRN: [id number]  Pt discharged to lobby. Pt was stable and appreciative at that time. All papers and prescriptions were given and valuables returned. Verbal understanding expressed. Denies SI/HI and A/VH. Pt given opportunity to express concerns and ask questions.

## 2018-07-15 NOTE — [facility] Group Notes (Signed)
Adult Psychoeducational Group Note  Date:  07/15/2018 Time:  9:23 AM  Group Topic/Focus:  Orientation:   The focus of this group is to educate the patient on the purpose and policies of crisis stabilization and provide a format to answer questions about their admission.  The group details unit policies and expectations of patients while admitted.  Participation Level:  Active  Participation Quality:  Appropriate  Affect:  Appropriate  Cognitive:  Alert  Insight: Appropriate  Engagement in Group:  Engaged  Modes of Intervention:  Orientation  Additional Comments:  Pt attended orientation group facilitated by Jacquelyne Balint 07/15/2018, 9:23 AM

## 2018-07-15 NOTE — Progress Notes (Signed)
Recreation Therapy Notes  Date: 11.11.19 Time: 0930 Location: [address] Dayroom  Group Topic: Stress Management  Goal Area(s) Addresses:  Patient will verbalize importance of using healthy stress management.  Patient will identify positive emotions associated with healthy stress management.   Behavioral Response: Engaged  Intervention: Stress Management  Activity :  Progressive Muscle Relaxation.  LRT introduced the stress management technique of progressive muscle relaxation.  LRT read Wilkinson script to lead patients through Wilkinson series of exercises that allowed them to tense and then relax each muscle group individually.    Education:  Stress Management, Discharge Planning.   Education Outcome: Acknowledges edcuation/In group clarification offered/Needs additional education  Clinical Observations/Feedback: Pt attended and participated in group.    Albert Wilkinson, LRT/CTRS         Albert Wilkinson, Albert Wilkinson 07/15/2018 10:57 AM

## 2018-07-15 NOTE — [facility] Suicide Risk Assessment (Signed)
[facility] Discharge Suicide Risk Assessment   Principal Problem: Severe recurrent major depression without psychotic features [facility]) Discharge Diagnoses:  Patient Active Problem List   Diagnosis Date Noted  . Uncomplicated opioid dependence (HCC) [F11.20]   . Severe recurrent major depression without psychotic features (HCC) [F33.2] 07/13/2018    Total Time spent with patient: 30 minutes  Psychiatric Specialty Exam:   Blood pressure 121/74, pulse 72, temperature 98 F (36.7 C), temperature source Oral, resp. rate 20, height 5\' 8"  (1.727 m), weight 59.9 kg.Body mass index is 20.07 kg/m.   Mental Status Per Nursing Assessment::   On Admission:  NA  Demographic Factors:  Male, Adolescent or young adult, Caucasian and Low socioeconomic status  Loss Factors: Loss of significant relationship and Financial problems/change in socioeconomic status  Historical Factors: Impulsivity  Risk Reduction Factors:   Responsible for children under 41 years of age, Sense of responsibility to family, Living with another person, especially a relative, Positive social support, Positive therapeutic relationship and Positive coping skills or problem solving skills  Continued Clinical Symptoms:  Depression:   Impulsivity Alcohol/Substance Abuse/Dependencies More than one psychiatric diagnosis  Cognitive Features That Contribute To Risk:  None    Suicide Risk:  Minimal: No identifiable suicidal ideation.  Patients presenting with no risk factors but with morbid ruminations; may be classified as minimal risk based on the severity of the depressive symptoms  Follow-up Information    [facility] Follow up.   Specialty:  Behavioral Health Why:  Scheduling office closed today for holiday. Walk in within 3 days of hospital discharge to be assessed for outpatient mental health services including: medication management and therapy. WALK IN HOURS: MON-FRI 8am-10am. Thank you.  Contact information: [address] [place] [address] [phone]           Plan Of Care/Follow-up recommendations:  Activity:  as tolerated Diet:  normal Tests:  NA Other:  see above for DC plan  Micheal Likens, MD 07/15/2018, 10:31 AM

## 2018-07-15 NOTE — Tx Team (Signed)
Interdisciplinary Treatment and Diagnostic Plan Update  07/15/2018 Time of Session: 0830AM Albert Wilkinson MRN: [id number]  Principal Diagnosis: Severe recurrent major depression without psychotic features [facility])  Secondary Diagnoses: Principal Problem:   Severe recurrent major depression without psychotic features (HCC) Active Problems:   Uncomplicated opioid dependence (HCC)   Current Medications:  Current Facility-Administered Medications  Medication Dose Route Frequency Provider Last Rate Last Dose  . acetaminophen (TYLENOL) tablet 650 mg  650 mg Oral Q4H PRN Ravi, Himabindu, MD      . alum & mag hydroxide-simeth (MAALOX/MYLANTA) 200-200-20 MG/5ML suspension 30 mL  30 mL Oral Q4H PRN Nira Conn A, NP      . citalopram (CELEXA) tablet 10 mg  10 mg Oral Daily Antonieta Pert, MD   10 mg at 07/15/18 4098  . hydrOXYzine (ATARAX/VISTARIL) tablet 25 mg  25 mg Oral Q6H PRN Nira Conn A, NP      . magnesium hydroxide (MILK OF MAGNESIA) suspension 30 mL  30 mL Oral Daily PRN Nira Conn A, NP      . nicotine (NICODERM CQ - dosed in mg/24 hours) patch 21 mg  21 mg Transdermal Daily Antonieta Pert, MD   21 mg at 07/15/18 0824  . traZODone (DESYREL) tablet 50 mg  50 mg Oral QHS PRN Nira Conn A, NP       PTA Medications: No medications prior to admission.    Patient Stressors: Marital or family conflict Substance abuse  Patient Strengths: Ability for insight Average or above average intelligence Communication skills General fund of knowledge  Treatment Modalities: Medication Management, Group therapy, Case management,  1 to 1 session with clinician, Psychoeducation, Recreational therapy.   Physician Treatment Plan for Primary Diagnosis: Severe recurrent major depression without psychotic features (HCC) Long Term Goal(s): Improvement in symptoms so as ready for discharge Improvement in symptoms so as ready for discharge   Short Term Goals: Ability to identify changes  in lifestyle to reduce recurrence of condition will improve Ability to verbalize feelings will improve Ability to disclose and discuss suicidal ideas Ability to identify and develop effective coping behaviors will improve Ability to maintain clinical measurements within normal limits will improve Compliance with prescribed medications will improve  Medication Management: Evaluate patient's response, side effects, and tolerance of medication regimen.  Therapeutic Interventions: 1 to 1 sessions, Unit Group sessions and Medication administration.  Evaluation of Outcomes: Adequate for Discharge  Physician Treatment Plan for Secondary Diagnosis: Principal Problem:   Severe recurrent major depression without psychotic features (HCC) Active Problems:   Uncomplicated opioid dependence (HCC)  Long Term Goal(s): Improvement in symptoms so as ready for discharge Improvement in symptoms so as ready for discharge   Short Term Goals: Ability to identify changes in lifestyle to reduce recurrence of condition will improve Ability to verbalize feelings will improve Ability to disclose and discuss suicidal ideas Ability to identify and develop effective coping behaviors will improve Ability to maintain clinical measurements within normal limits will improve Compliance with prescribed medications will improve     Medication Management: Evaluate patient's response, side effects, and tolerance of medication regimen.  Therapeutic Interventions: 1 to 1 sessions, Unit Group sessions and Medication administration.  Evaluation of Outcomes: Adequate for Discharge   RN Treatment Plan for Primary Diagnosis: Severe recurrent major depression without psychotic features (HCC) Long Term Goal(s): Knowledge of disease and therapeutic regimen to maintain health will improve  Short Term Goals: Ability to remain free from injury will improve, Ability to verbalize  frustration and anger appropriately will improve,  Ability to disclose and discuss suicidal ideas and Ability to identify and develop effective coping behaviors will improve  Medication Management: RN will administer medications as ordered by provider, will assess and evaluate patient's response and provide education to patient for prescribed medication. RN will report any adverse and/or side effects to prescribing provider.  Therapeutic Interventions: 1 on 1 counseling sessions, Psychoeducation, Medication administration, Evaluate responses to treatment, Monitor vital signs and CBGs as ordered, Perform/monitor CIWA, COWS, AIMS and Fall Risk screenings as ordered, Perform wound care treatments as ordered.  Evaluation of Outcomes: Adequate for Discharge   LCSW Treatment Plan for Primary Diagnosis: Severe recurrent major depression without psychotic features (HCC) Long Term Goal(s): Safe transition to appropriate next level of care at discharge, Engage patient in therapeutic group addressing interpersonal concerns.  Short Term Goals: Engage patient in aftercare planning with referrals and resources, Facilitate patient progression through stages of change regarding substance use diagnoses and concerns and Identify triggers associated with mental health/substance abuse issues  Therapeutic Interventions: Assess for all discharge needs, 1 to 1 time with Social worker, Explore available resources and support systems, Assess for adequacy in community support network, Educate family and significant other(s) on suicide prevention, Complete Psychosocial Assessment, Interpersonal group therapy.  Evaluation of Outcomes: Adequate for Discharge   Progress in Treatment: Attending groups: Yes. Participating in groups: Yes. Taking medication as prescribed: Yes. Toleration medication: Yes. Family/Significant other contact made:  SPE completed with pt and his mother. Collateral information also obtained.  Patient understands diagnosis: Yes. Discussing patient  identified problems/goals with staff: Yes. Medical problems stabilized or resolved: Yes. Denies suicidal/homicidal ideation: Yes. Issues/concerns per patient self-inventory: No. Other: n/a   New problem(s) identified: No, Describe:  n/a  New Short Term/Long Term Goal(s): detox, medication management for mood stabilization; elimination of SI thoughts; development of comprehensive mental wellness/sobriety plan.   Patient Goals:  "whatever I can do to help with my depression and anxiety."   Discharge Plan or Barriers: Pt agreeable to [facility] appt and has been provided MHAG and NA information for additional community support. He plans to return home with girlfriend and child. CSW reviewed SPE with pt's mother.   Reason for Continuation of Hospitalization: none  Estimated Length of Stay: Monday, 07/15/18  Attendees: Patient: 07/15/2018 11:11 AM  Physician: Dr. Altamese Carlton MD 07/15/2018 11:11 AM  Nursing: Arlyss Repress RN; Huntley Dec RN 07/15/2018 11:11 AM  RN Care Manager:x 07/15/2018 11:11 AM  Social Worker: Corrie Mckusick LCSW 07/15/2018 11:11 AM  Recreational Therapist: x 07/15/2018 11:11 AM  Other: Armandina Stammer NP; Hillery Jacks NP 07/15/2018 11:11 AM  Other:  07/15/2018 11:11 AM  Other: 07/15/2018 11:11 AM    Scribe for Treatment Team: Rona Ravens, LCSW 07/15/2018 11:11 AM

## 2018-07-15 NOTE — Progress Notes (Signed)
Patient ID: Albert Wilkinson, male   DOB: 1995-09-18, 22 y.o.   MRN: [id number]

## 2018-07-15 NOTE — Discharge Summary (Signed)
Physician Discharge Summary Note  Patient:  Albert Wilkinson is an 22 y.o., male MRN:  [id number] DOB:  [birth year] Patient phone:  [phone] (home)  Patient address:   [address] [place] [address],  Total Time spent with patient: 30 minutes  Date of Admission:  07/13/2018 Date of Discharge: 07/15/2018  Reason for Admission:  Depression, suicide attempt via overdose, heroin use  Principal Problem: Severe recurrent major depression without psychotic features [facility]) Discharge Diagnoses: Patient Active Problem List   Diagnosis Date Noted  . Uncomplicated opioid dependence (HCC) [F11.20]   . Severe recurrent major depression without psychotic features (HCC) [F33.2] 07/13/2018    Past Psychiatric History: see H&P  Past Medical History: History reviewed. No pertinent past medical history. History reviewed. No pertinent surgical history. Family History: History reviewed. No pertinent family history. Family Psychiatric  History: see H&P Social History:  Social History   Substance and Sexual Activity  Alcohol Use No     Social History   Substance and Sexual Activity  Drug Use Yes  . Types: Heroin    Social History   Socioeconomic History  . Marital status: Single    Spouse name: Not on file  . Number of children: Not on file  . Years of education: Not on file  . Highest education level: Not on file  Occupational History  . Not on file  Social Needs  . Financial resource strain: Not on file  . Food insecurity:    Worry: Not on file    Inability: Not on file  . Transportation needs:    Medical: Not on file    Non-medical: Not on file  Tobacco Use  . Smoking status: Current Every Day Smoker  . Smokeless tobacco: Never Used  Substance and Sexual Activity  . Alcohol use: No  . Drug use: Yes    Types: Heroin  . Sexual activity: Yes  Lifestyle  . Physical activity:    Days per week: Not on file    Minutes per session: Not on file  . Stress: Not on file   Relationships  . Social connections:    Talks on phone: Not on file    Gets together: Not on file    Attends religious service: Not on file    Active member of club or organization: Not on file    Attends meetings of clubs or organizations: Not on file    Relationship status: Not on file  Other Topics Concern  . Not on file  Social History Narrative  . Not on file    Hospital Course:    History as per psychiatric intake suicide risk assessment: Patient is a 22 year old male with a past psychiatric history significant for opioid dependence who presented to the [facility] emergency department on 07/12/2018 after an intentional overdose of heroin.  Patient stated that he had been having problems with the biological mother of his child.  He stated that he gets into arguments with her over custody issues of their child.  They are trying to work these things out, but he continues to get upset over dealing with her.  On the night of admission he got into an argument, and this upset him so greatly that he decided to take an intentional overdose of heroin.  He stated he snorted to $20 bag of heroin in an attempt to kill himself.  He has a history of opioid dependence, but approximately 8 to 12 months ago had stopped using drugs on  his own.  He has been clean for several months until the night of admission.  He also had recently got a job in a [employer].  He denied any previous self-harm, he denied any previous psychiatric admissions, he denied any previous admissions for substance rehabilitation.  He currently lives with his mother and his sister.  After the intentional overdose of heroin he was found by [facility] police after they were called for a wellness check.  He received 1 dose of intranasal Narcan to which he responded quite well.  He was basically kept in the emergency room for 2 days until a bed could be found for admission.  He was admitted to the hospital for evaluation and  stabilization.  He denied any devious treatment for depression.  He stated that he was feeling depressed about the circumstances with the biological mother of the child.  He stated he felt at times helpless, hopeless, worthless, upset, and angry.  He was admitted to the hospital for evaluation and stabilization.  As per evaluation today: Today upon evaluation, pt shares, "I'm doing good." Regarding his reasons for admission, pt shares, "Depression caused me to attempt suicide." Pt reports that he was feeling overwhelmed at time of suicide attempt, and he had not been planning his attempt. He is feeling much better overall since being admitted, and he notes that his mood has improved significantly. He denies any specific concerns. He is sleeping well. His appetite is good. He denies other physical complaints. He denies SI/HI/AH/VH. He is tolerating his medications well, and he is in agreement to continue his current regimen without changes. He would like to discharge today. He plans to return to staying at home. He cites several protective factors including being father to his 52.64 year old son. He is in agreement to follow up at [facility]. He was able to engage in safety planning including plan to return to [facility] or contact emergency services if he feels unable to maintain his own safety or the safety of others. Pt had no further questions, comments, or concerns.   The patient is at low risk of imminent suicide. Patient denied thoughts, intent, or plan for harm to self or others, expressed significant future orientation, and expressed an ability to mobilize assistance for his needs. He is presently void of any contributing psychiatric symptoms, cognitive difficulties, or substance use which would elevate his risk for lethality. Chronic risk for lethality is elevated in light of poor social support, poor adherence, and impulsivity. The chronic risk is presently mitigated by his ongoing desire and engagement in [facility]  treatment and mobilization of support from family and friends. Chronic risk may elevate if he experiences any significant loss or worsening of symptoms, which can be managed and monitored through outpatient providers. At this time, acute risk for lethality is low and he is stable for ongoing outpatient management.    Modifiable risk factors were addressed during this hospitalization through appropriate pharmacotherapy and establishment of outpatient follow-up treatment. Some risk factors for suicide are situational (i.e. Unstable social support) or related personality pathology (i.e. Poor coping mechanisms) and thus cannot be further mitigated by continued hospitalization in this setting.   Physical Findings: AIMS: Facial and Oral Movements Muscles of Facial Expression: None, normal Lips and Perioral Area: None, normal Jaw: None, normal Tongue: None, normal,Extremity Movements Upper (arms, wrists, hands, fingers): None, normal Lower (legs, knees, ankles, toes): None, normal, Trunk Movements Neck, shoulders, hips: None, normal, Overall Severity Severity of abnormal movements (highest score  from questions above): None, normal Incapacitation due to abnormal movements: None, normal Patient's awareness of abnormal movements (rate only patient's report): No Awareness, Dental Status Current problems with teeth and/or dentures?: No Does patient usually wear dentures?: No  CIWA:    COWS:     Musculoskeletal: Strength & Muscle Tone: within normal limits Gait & Station: normal Patient leans: N/A  Psychiatric Specialty Exam: Physical Exam  Nursing note and vitals reviewed.   Review of Systems  Constitutional: Negative for chills and fever.  Respiratory: Negative for cough and shortness of breath.   Cardiovascular: Negative for chest pain.  Gastrointestinal: Negative for abdominal pain, heartburn, nausea and vomiting.  Psychiatric/Behavioral: Negative for depression, hallucinations and suicidal  ideas. The patient is not nervous/anxious and does not have insomnia.     Blood pressure 121/74, pulse 72, temperature 98 F (36.7 C), temperature source Oral, resp. rate 20, height 5\' 8"  (1.727 m), weight 59.9 kg.Body mass index is 20.07 kg/m.  General Appearance: Casual and Fairly Groomed  Eye Contact:  Good  Speech:  Clear and Coherent and Normal Rate  Volume:  Normal  Mood:  Euthymic  Affect:  Appropriate and Congruent  Thought Process:  Coherent and Goal Directed  Orientation:  Full (Time, Place, and Person)  Thought Content:  Logical  Suicidal Thoughts:  No  Homicidal Thoughts:  No  Memory:  Immediate;   Fair Recent;   Fair Remote;   Fair  Judgement:  Fair  Insight:  Lacking  Psychomotor Activity:  Normal  Concentration:  Concentration: Fair  Recall:  [employer] of Knowledge:  Fair  Language:  Good  Akathisia:  No  Handed:    AIMS (if indicated):     Assets:  Resilience  ADL's:  Intact  Cognition:  WNL  Sleep:  Number of Hours: 6     Have you used any form of tobacco in the last 30 days? (Cigarettes, Smokeless Tobacco, Cigars, and/or Pipes): Yes  Has this patient used any form of tobacco in the last 30 days? (Cigarettes, Smokeless Tobacco, Cigars, and/or Pipes) Yes, Yes, A prescription for an FDA-approved tobacco cessation medication was offered at discharge and the patient refused  Blood Alcohol level:  Lab Results  Component Value Date   ETH <10 07/12/2018    Metabolic Disorder Labs:  No results found for: HGBA1C, MPG No results found for: PROLACTIN No results found for: CHOL, TRIG, HDL, CHOLHDL, VLDL, LDLCALC  See Psychiatric Specialty Exam and Suicide Risk Assessment completed by Attending Physician prior to discharge.  Discharge destination:  Home  Is patient on multiple antipsychotic therapies at discharge:  No   Has Patient had three or more failed trials of antipsychotic monotherapy by history:  No  Recommended Plan for Multiple Antipsychotic  Therapies: NA   Allergies as of 07/15/2018   No Known Allergies     Medication List    TAKE these medications     Indication  citalopram 10 MG tablet Commonly known as:  CELEXA Take 1 tablet (10 mg total) by mouth daily. Start taking on:  07/16/2018  Indication:  Depression   hydrOXYzine 25 MG tablet Commonly known as:  ATARAX/VISTARIL Take 1 tablet (25 mg total) by mouth every 6 (six) hours as needed for anxiety.  Indication:  Feeling Anxious   traZODone 50 MG tablet Commonly known as:  DESYREL Take 1 tablet (50 mg total) by mouth at bedtime as needed for sleep.  Indication:  Trouble Sleeping      Follow-up  Information    [facility] Follow up.   Specialty:  Behavioral Health Why:  Scheduling office closed today for holiday. Walk in within 3 days of hospital discharge to be assessed for outpatient mental health services including: medication management and therapy. WALK IN HOURS: MON-FRI 8am-10am. Thank you.  [address] [place] [address] [phone]           Follow-up recommendations:  Activity:  as tolerated Diet:  normal Tests:  NA Other:  see above for DC plan  Comments:    Signed: Micheal Likens, MD 07/15/2018, 10:31 AM

## 2018-11-20 ENCOUNTER — Emergency Department (HOSPITAL_COMMUNITY)
Admission: EM | Admit: 2018-11-20 | Discharge: 2018-11-20 | Disposition: A | Discharge status: Home / Self Care | Payer: Medicaid Other | Attending: Emergency Medicine | Admitting: Emergency Medicine

## 2018-11-20 ENCOUNTER — Encounter (HOSPITAL_COMMUNITY): Payer: Self-pay | Admitting: Emergency Medicine

## 2018-11-20 DIAGNOSIS — F17200 Nicotine dependence, unspecified, uncomplicated: Secondary | ICD-10-CM | POA: Insufficient documentation

## 2018-11-20 DIAGNOSIS — F1110 Opioid abuse, uncomplicated: Secondary | ICD-10-CM

## 2018-11-20 DIAGNOSIS — Z79899 Other long term (current) drug therapy: Secondary | ICD-10-CM | POA: Insufficient documentation

## 2018-11-20 DIAGNOSIS — F1120 Opioid dependence, uncomplicated: Secondary | ICD-10-CM | POA: Insufficient documentation

## 2018-11-20 NOTE — ED Provider Notes (Signed)
[facility]-EMERGENCY DEPT Provider Note   CSN: [id number] Arrival date & time: 11/20/18  0532    History   Chief Complaint Chief Complaint  Patient presents with  . Detox  . Nausea    HPI Albert Wilkinson is a 23 y.o. male.     The history is provided by the patient.  Illness  Severity:  Moderate Onset quality:  Gradual Timing:  Constant Chronicity:  Chronic Context:  Patient has been a heroin user daily for many months until he quit 5 days ago and would like detox for this.  He denies any other substances and also denies SI or HI.  Denies auditory and visual hallucinations Relieved by:  Time Worsened by:  Nothing Ineffective treatments:  None Associated symptoms: no abdominal pain, no chest pain, no congestion, no cough, no diarrhea, no ear pain, no fatigue, no fever, no headaches, no loss of consciousness, no myalgias, no nausea, no rash, no rhinorrhea, no shortness of breath, no sore throat, no vomiting and no wheezing     No past medical history on file.  Patient Active Problem List   Diagnosis Date Noted  . Uncomplicated opioid dependence (HCC)   . Severe recurrent major depression without psychotic features (HCC) 07/13/2018    No past surgical history on file.      Home Medications    Prior to Admission medications   Medication Sig Start Date End Date Taking? Authorizing Provider  citalopram (CELEXA) 10 MG tablet Take 1 tablet (10 mg total) by mouth daily. 07/16/18   Micheal Likens, MD  hydrOXYzine (ATARAX/VISTARIL) 25 MG tablet Take 1 tablet (25 mg total) by mouth every 6 (six) hours as needed for anxiety. 07/15/18   Micheal Likens, MD  traZODone (DESYREL) 50 MG tablet Take 1 tablet (50 mg total) by mouth at bedtime as needed for sleep. 07/15/18   Micheal Likens, MD    Family History No family history on file.  Social History Social History   Tobacco Use  . Smoking status: Current Every Day Smoker   . Smokeless tobacco: Never Used  Substance Use Topics  . Alcohol use: No  . Drug use: Yes    Types: Heroin     Allergies   Patient has no known allergies.   Review of Systems Review of Systems  Constitutional: Negative for fatigue and fever.  HENT: Negative for congestion, ear pain, rhinorrhea and sore throat.   Respiratory: Negative for cough, shortness of breath and wheezing.   Cardiovascular: Negative for chest pain.  Gastrointestinal: Negative for abdominal pain, diarrhea, nausea and vomiting.  Musculoskeletal: Negative for myalgias.  Skin: Negative for rash.  Neurological: Negative for loss of consciousness and headaches.  Psychiatric/Behavioral: Negative for behavioral problems, confusion, decreased concentration, dysphoric mood, hallucinations, self-injury and suicidal ideas.  All other systems reviewed and are negative.    Physical Exam Updated Vital Signs There were no vitals taken for this visit.  Physical Exam Vitals signs and nursing note reviewed.  Constitutional:      General: He is not in acute distress.    Appearance: Normal appearance. He is normal weight.  HENT:     Head: Normocephalic and atraumatic.     Nose: Nose normal.     Mouth/Throat:     Mouth: Mucous membranes are moist.     Pharynx: Oropharynx is clear.  Eyes:     Conjunctiva/sclera: Conjunctivae normal.     Pupils: Pupils are equal, round, and reactive to light.  Neck:     Musculoskeletal: Normal range of motion and neck supple.  Cardiovascular:     Rate and Rhythm: Normal rate and regular rhythm.     Pulses: Normal pulses.     Heart sounds: Normal heart sounds.  Pulmonary:     Effort: Pulmonary effort is normal.     Breath sounds: Normal breath sounds.  Abdominal:     General: Abdomen is flat. Bowel sounds are normal.     Tenderness: There is no abdominal tenderness. There is no guarding or rebound.  Musculoskeletal: Normal range of motion.  Skin:    General: Skin is warm and  dry.     Capillary Refill: Capillary refill takes less than 2 seconds.  Neurological:     General: No focal deficit present.     Mental Status: He is alert and oriented to person, place, and time.  Psychiatric:        Attention and Perception: He is attentive. He does not perceive auditory or visual hallucinations.        Mood and Affect: Mood normal.        Behavior: Behavior normal. Behavior is not aggressive.        Thought Content: Thought content is not paranoid. Thought content does not include homicidal or suicidal ideation. Thought content does not include homicidal or suicidal plan.      ED Treatments / Results  Labs (all labs ordered are listed, but only abnormal results are displayed) Labs Reviewed - No data to display  EKG None  Radiology No results found.  Procedures Procedures (including critical care time)  Medications Ordered in ED Medications - No data to display  PO challenged successfully in the department    I have explained that we do not do inpatient detox for heroin here and without suicidal or homicidal ideations he would be given outpatient resources. He verbalizes understanding and agrees to follow up.  Without vomiting or any signs or symptoms of disease I do not believe labs or imaging are indicated at this time    Final Clinical Impressions(s) / ED Diagnoses   Return for intractable cough, coughing up blood,fevers >100.4 unrelieved by medication, shortness of breath, intractable vomiting, chest pain, shortness of breath, weakness,numbness, changes in speech, facial asymmetry,abdominal pain, passing out,Inability to tolerate liquids or food, cough, altered mental status or any concerns. No signs of systemic illness or infection. The patient is nontoxic-appearing on exam and vital signs are within normal limits.   I have reviewed the triage vital signs and the nursing notes. Pertinent labs &imaging results that were available during my care  of the patient were reviewed by me and considered in my medical decision making (see chart for details).  After history, exam, and medical workup I feel the patient has been appropriately medically screened and is safe for discharge home. Pertinent diagnoses were discussed with the patient. Patient was given return precautions.   Keanen Dohse, MD 11/20/18 1610

## 2018-11-20 NOTE — ED Triage Notes (Signed)
Patient here from home with complaints of detox x3 days from heroin. Nausea. 4mg  Zofran IV given.

## 2018-11-20 NOTE — ED Notes (Signed)
Bed: WA20 Expected date:  Expected time:  Means of arrival:  Comments: Detox, heroin

## 2020-07-23 ENCOUNTER — Encounter (HOSPITAL_COMMUNITY): Payer: Self-pay | Admitting: Emergency Medicine

## 2020-07-23 ENCOUNTER — Emergency Department (HOSPITAL_COMMUNITY)
Admission: EM | Admit: 2020-07-23 | Discharge: 2020-07-24 | Disposition: A | Discharge status: Home / Self Care | Payer: Self-pay | Attending: Emergency Medicine | Admitting: Emergency Medicine

## 2020-07-23 ENCOUNTER — Other Ambulatory Visit: Payer: Self-pay

## 2020-07-23 DIAGNOSIS — T40601A Poisoning by unspecified narcotics, accidental (unintentional), initial encounter: Secondary | ICD-10-CM

## 2020-07-23 DIAGNOSIS — F17200 Nicotine dependence, unspecified, uncomplicated: Secondary | ICD-10-CM | POA: Insufficient documentation

## 2020-07-23 DIAGNOSIS — T402X1A Poisoning by other opioids, accidental (unintentional), initial encounter: Secondary | ICD-10-CM | POA: Insufficient documentation

## 2020-07-23 DIAGNOSIS — R739 Hyperglycemia, unspecified: Secondary | ICD-10-CM | POA: Insufficient documentation

## 2020-07-23 NOTE — ED Triage Notes (Addendum)
Pt arrives via [facility] for suspected drug overdose. Pt admits to using an unknown drug. Family assisted with breathing and was providing CPR when EMS arrived. Pt given a total of 12mg  Narcan on scene. Pt arrives A&O x4.

## 2020-07-24 LAB — COMPREHENSIVE METABOLIC PANEL
ALT: 20 U/L (ref 0–44)
AST: 26 U/L (ref 15–41)
Albumin: 4.5 g/dL (ref 3.5–5.0)
Alkaline Phosphatase: 88 U/L (ref 38–126)
Anion gap: 10 (ref 5–15)
BUN: 9 mg/dL (ref 6–20)
CO2: 24 mmol/L (ref 22–32)
Calcium: 8.9 mg/dL (ref 8.9–10.3)
Chloride: 100 mmol/L (ref 98–111)
Creatinine, Ser: 1.31 mg/dL — ABNORMAL HIGH (ref 0.61–1.24)
GFR, Estimated: >60 mL/min (ref >60)
Glucose, Bld: 335 mg/dL — ABNORMAL HIGH (ref 70–99)
Potassium: 4.5 mmol/L (ref 3.5–5.1)
Sodium: 134 mmol/L — ABNORMAL LOW (ref 135–145)
Total Bilirubin: 0.6 mg/dL (ref 0.3–1.2)
Total Protein: 8.1 g/dL (ref 6.5–8.1)

## 2020-07-24 LAB — CBC WITH DIFFERENTIAL/PLATELET
Abs Immature Granulocytes: 0.07 10*3/uL (ref 0.00–0.07)
Basophils Absolute: 0 10*3/uL (ref 0.0–0.1)
Basophils Relative: 0 %
Eosinophils Absolute: 0.1 10*3/uL (ref 0.0–0.5)
Eosinophils Relative: 1 %
HCT: 47.7 % (ref 39.0–52.0)
Hemoglobin: 15.5 g/dL (ref 13.0–17.0)
Immature Granulocytes: 1 %
Lymphocytes Relative: 16 %
Lymphs Abs: 1.5 10*3/uL (ref 0.7–4.0)
MCH: 29.4 pg (ref 26.0–34.0)
MCHC: 32.5 g/dL (ref 30.0–36.0)
MCV: 90.5 fL (ref 80.0–100.0)
Monocytes Absolute: 0.5 10*3/uL (ref 0.1–1.0)
Monocytes Relative: 6 %
Neutro Abs: 7 10*3/uL (ref 1.7–7.7)
Neutrophils Relative %: 76 %
Platelets: 235 10*3/uL (ref 150–400)
RBC: 5.27 MIL/uL (ref 4.22–5.81)
RDW: 12.7 % (ref 11.5–15.5)
WBC: 9.2 10*3/uL (ref 4.0–10.5)
nRBC: 0 % (ref 0.0–0.2)

## 2020-07-24 LAB — CBG MONITORING, ED
Glucose-Capillary: 73 mg/dL (ref 70–99)
Glucose-Capillary: 79 mg/dL (ref 70–99)

## 2020-07-24 LAB — ETHANOL: Alcohol, Ethyl (B): <10 mg/dL (ref <10)

## 2020-07-24 MED ORDER — SODIUM CHLORIDE 0.9 % IV BOLUS
1000.0000 mL | Freq: Once | INTRAVENOUS | Status: AC
  Administered 2020-07-24: 1000 mL via INTRAVENOUS

## 2020-07-24 MED ORDER — SODIUM CHLORIDE 0.9 % IV BOLUS
500.0000 mL | Freq: Once | INTRAVENOUS | Status: AC
  Administered 2020-07-24: 500 mL via INTRAVENOUS

## 2020-07-24 NOTE — Discharge Instructions (Addendum)
You need to stay away from drugs and medications that are not prescribed to you by a physician.

## 2020-07-24 NOTE — ED Provider Notes (Signed)
[facility] EMERGENCY DEPARTMENT Provider Note   CSN: [id number] Arrival date & time: 07/23/20  2243   Time seen 11:09 PM  History Chief Complaint  Patient presents with  . Drug Overdose   Level 5 caveat for altered mental status  Albert Wilkinson is a 24 y.o. male.  HPI Patient states he did some drugs tonight.  However he cannot tell me what he thought he was taking.  When asked what drugs he normally takes he states "none really".  Review of his chart shows he has a history of heroin abuse.  He denies doing that recently.  EMS reports family was doing CPR.  EMS gave him 12 mg of Narcan and he did awaken.  Patient states he does not remember any of the events prior to this including being in the ambulance.  Patient is very slow in mentation and has difficulty answering questions.  PCP Patient, No Pcp Per     History reviewed. No pertinent past medical history.  Patient Active Problem List   Diagnosis Date Noted  . Uncomplicated opioid dependence (HCC)   . Severe recurrent major depression without psychotic features (HCC) 07/13/2018    History reviewed. No pertinent surgical history.     History reviewed. No pertinent family history.  Social History   Tobacco Use  . Smoking status: Current Every Day Smoker  . Smokeless tobacco: Never Used  Substance Use Topics  . Alcohol use: No  . Drug use: Yes    Types: Heroin    Home Medications Prior to Admission medications   Medication Sig Start Date End Date Taking? Authorizing Provider  citalopram (CELEXA) 10 MG tablet Take 1 tablet (10 mg total) by mouth daily. Patient not taking: Reported on 11/20/2018 07/16/18   Micheal Likens, MD  hydrOXYzine (ATARAX/VISTARIL) 25 MG tablet Take 1 tablet (25 mg total) by mouth every 6 (six) hours as needed for anxiety. Patient not taking: Reported on 11/20/2018 07/15/18   Micheal Likens, MD  traZODone (DESYREL) 50 MG tablet Take 1 tablet (50 mg total) by mouth at  bedtime as needed for sleep. Patient not taking: Reported on 11/20/2018 07/15/18   Micheal Likens, MD    Allergies    Patient has no known allergies.  Review of Systems   Review of Systems  Unable to perform ROS: Mental status change    Physical Exam Updated Vital Signs BP 118/75   Pulse (!) 106   Temp (!) 97.5 F (36.4 C) (Oral)   Resp 14   Ht 5\' 8"  (1.727 m)   Wt 65.8 kg   SpO2 95%   BMI 22.05 kg/m   Physical Exam Vitals and nursing note reviewed.  Constitutional:      Comments: Sleepy but easily arousable  HENT:     Head: Normocephalic and atraumatic.     Right Ear: External ear normal.     Left Ear: External ear normal.     Nose: Nose normal.  Eyes:     General:        Right eye: No discharge.     Extraocular Movements: Extraocular movements intact.     Conjunctiva/sclera: Conjunctivae normal.     Pupils: Pupils are equal, round, and reactive to light.     Comments: Pupils are midsize and reactive to  Cardiovascular:     Rate and Rhythm: Normal rate and regular rhythm.     Pulses: Normal pulses.     Heart sounds: Normal heart sounds.  Pulmonary:  Effort: Pulmonary effort is normal. No respiratory distress.     Breath sounds: Normal breath sounds.  Musculoskeletal:        General: Normal range of motion.     Cervical back: Normal range of motion and neck supple. No rigidity.  Skin:    General: Skin is warm and dry.     Comments: I did not see any obvious fresh or old needle tracks.  Neurological:     General: No focal deficit present.     Cranial Nerves: No cranial nerve deficit.  Psychiatric:        Mood and Affect: Affect is flat.        Speech: Speech is delayed and slurred.        Behavior: Behavior is slowed.     ED Results / Procedures / Treatments   Labs (all labs ordered are listed, but only abnormal results are displayed) Results for orders placed or performed during the hospital encounter of 07/23/20  Comprehensive metabolic  panel  Result Value Ref Range   Sodium 134 (L) 135 - 145 mmol/L   Potassium 4.5 3.5 - 5.1 mmol/L   Chloride 100 98 - 111 mmol/L   CO2 24 22 - 32 mmol/L   Glucose, Bld 335 (H) 70 - 99 mg/dL   BUN 9 6 - 20 mg/dL   Creatinine, Ser 6.83 (H) 0.61 - 1.24 mg/dL   Calcium 8.9 8.9 - 41.9 mg/dL   Total Protein 8.1 6.5 - 8.1 g/dL   Albumin 4.5 3.5 - 5.0 g/dL   AST 26 15 - 41 U/L   ALT 20 0 - 44 U/L   Alkaline Phosphatase 88 38 - 126 U/L   Total Bilirubin 0.6 0.3 - 1.2 mg/dL   GFR, Estimated >62 >22 mL/min   Anion gap 10 5 - 15  Ethanol  Result Value Ref Range   Alcohol, Ethyl (B) <10 <10 mg/dL  CBC with Differential  Result Value Ref Range   WBC 9.2 4.0 - 10.5 K/uL   RBC 5.27 4.22 - 5.81 MIL/uL   Hemoglobin 15.5 13.0 - 17.0 g/dL   HCT 97.9 39 - 52 %   MCV 90.5 80.0 - 100.0 fL   MCH 29.4 26.0 - 34.0 pg   MCHC 32.5 30.0 - 36.0 g/dL   RDW 89.2 11.9 - 41.7 %   Platelets 235 150 - 400 K/uL   nRBC 0.0 0.0 - 0.2 %   Neutrophils Relative % 76 %   Neutro Abs 7.0 1.7 - 7.7 K/uL   Lymphocytes Relative 16 %   Lymphs Abs 1.5 0.7 - 4.0 K/uL   Monocytes Relative 6 %   Monocytes Absolute 0.5 0.1 - 1.0 K/uL   Eosinophils Relative 1 %   Eosinophils Absolute 0.1 0.0 - 0.5 K/uL   Basophils Relative 0 %   Basophils Absolute 0.0 0.0 - 0.1 K/uL   Immature Granulocytes 1 %   Abs Immature Granulocytes 0.07 0.00 - 0.07 K/uL  CBG monitoring, ED  Result Value Ref Range   Glucose-Capillary 79 70 - 99 mg/dL   Comment 1 Repeat Test   CBG monitoring, ED  Result Value Ref Range   Glucose-Capillary 73 70 - 99 mg/dL   Laboratory interpretation all normal except patient did not provide urine sample for UDS   EKG EKG Interpretation  Date/Time:  Friday July 23 2020 23:11:01 EST Ventricular Rate:  99 PR Interval:    QRS Duration: 82 QT Interval:  369 QTC Calculation: 474 R  Axis:   75 Text Interpretation: Sinus rhythm Probable left atrial enlargement ST elev, probable normal early repol pattern  Borderline prolonged QT interval Since last tracing rate slower 12 Jul 2018 Confirmed by Devoria Albe ([id number]) on 07/24/2020 4:40:22 AM       #2 EKG   EKG Interpretation  Date/Time:  Saturday July 24 2020 00:50:27 EST Ventricular Rate:  98 PR Interval:    QRS Duration: 79 QT Interval:  358 QTC Calculation: 458 R Axis:   74 Text Interpretation: Sinus rhythm Probable left atrial enlargement Probable left ventricular hypertrophy ST elev, probable normal early repol pattern No significant change since last tracing about 1 1/2 hrs before Confirmed by Devoria Albe ([id number]) on 07/24/2020 4:41:52 AM        Radiology No results found.  Procedures Procedures (including critical care time)  Medications Ordered in ED Medications  sodium chloride 0.9 % bolus 1,000 mL (0 mLs Intravenous Stopped 07/24/20 0315)  sodium chloride 0.9 % bolus 500 mL (0 mLs Intravenous Stopped 07/24/20 0316)    ED Course  I have reviewed the triage vital signs and the nursing notes.  Pertinent labs & imaging results that were available during my care of the patient were reviewed by me and considered in my medical decision making (see chart for details).    MDM Rules/Calculators/A&P                         At the time of my exam patient's respiratory rate was 16 and his pulse ox was 100% on room air.  Laboratory testing was added.  Recheck at 1:10 AM patient seems more alert now.  He states he remembers talking to me earlier.  He still does not know what happened.  He states he remembered being in somebody's room and he does not know if he took any drugs or not.  He also states he does not have diabetes although he had a glucose of 268 on July 12, 2018.  He was given 1500 cc of IV fluids and we will recheck his CBG to see if it has improved.  Recheck at 4:30 AM patient is awake, able to hold conversation.  He still does not know what happened.  He is scratching all over as if he has had some type of  narcotic.  He was given IV fluids for his hyperglycemia and they have improved to 79 at around 3:00 and 73.  His hyper glycemia may be more of a stress reaction.  Patient is ready to be discharged.  Final Clinical Impression(s) / ED Diagnoses Final diagnoses:  Opiate overdose, accidental or unintentional, initial encounter (HCC)  Hyperglycemia    Rx / DC Orders ED Discharge Orders    None     Plan discharge  Devoria Albe, MD, Concha Pyo, MD 07/24/20 [phone]
